# Patient Record
Sex: Female | Born: 1970 | Race: Black or African American | Hispanic: No | Marital: Single | Smoking: Former smoker
Health system: Southern US, Community
[De-identification: ages and names within clinical notes are randomized; demographics above are authoritative.]

## PROBLEM LIST (undated history)

## (undated) DIAGNOSIS — I10 Essential (primary) hypertension: Secondary | ICD-10-CM

## (undated) HISTORY — PX: DENTAL SURGERY: SHX609

---

## 2002-12-28 ENCOUNTER — Other Ambulatory Visit: Admission: RE | Admit: 2002-12-28 | Discharge: 2002-12-28 | Payer: Self-pay | Admitting: Obstetrics and Gynecology

## 2003-09-17 ENCOUNTER — Emergency Department (HOSPITAL_COMMUNITY): Admission: EM | Admit: 2003-09-17 | Discharge: 2003-09-17 | Payer: Self-pay | Admitting: Family Medicine

## 2007-02-24 ENCOUNTER — Emergency Department (HOSPITAL_COMMUNITY): Admission: EM | Admit: 2007-02-24 | Discharge: 2007-02-24 | Payer: Self-pay | Admitting: Family Medicine

## 2007-06-10 ENCOUNTER — Emergency Department (HOSPITAL_COMMUNITY): Admission: EM | Admit: 2007-06-10 | Discharge: 2007-06-10 | Payer: Self-pay | Admitting: Emergency Medicine

## 2008-03-30 ENCOUNTER — Emergency Department (HOSPITAL_COMMUNITY): Admission: EM | Admit: 2008-03-30 | Discharge: 2008-03-30 | Payer: Self-pay | Admitting: Emergency Medicine

## 2008-11-25 ENCOUNTER — Emergency Department (HOSPITAL_COMMUNITY): Admission: EM | Admit: 2008-11-25 | Discharge: 2008-11-25 | Payer: Self-pay | Admitting: Emergency Medicine

## 2010-05-15 LAB — URINALYSIS, ROUTINE W REFLEX MICROSCOPIC
Glucose, UA: NEGATIVE mg/dL
Ketones, ur: NEGATIVE mg/dL
Urobilinogen, UA: 0.2 mg/dL (ref 0.0–1.0)
pH: 5.5 (ref 5.0–8.0)

## 2010-05-15 LAB — PREGNANCY, URINE: Preg Test, Ur: NEGATIVE

## 2010-10-03 ENCOUNTER — Emergency Department (HOSPITAL_COMMUNITY)
Admission: EM | Admit: 2010-10-03 | Discharge: 2010-10-03 | Disposition: A | Discharge status: Home / Self Care | Payer: No Typology Code available for payment source | Attending: Emergency Medicine | Admitting: Emergency Medicine

## 2010-10-03 DIAGNOSIS — T148XXA Other injury of unspecified body region, initial encounter: Secondary | ICD-10-CM | POA: Insufficient documentation

## 2010-10-03 DIAGNOSIS — M79609 Pain in unspecified limb: Secondary | ICD-10-CM | POA: Insufficient documentation

## 2010-10-03 DIAGNOSIS — I10 Essential (primary) hypertension: Secondary | ICD-10-CM | POA: Insufficient documentation

## 2010-10-03 DIAGNOSIS — M542 Cervicalgia: Secondary | ICD-10-CM | POA: Insufficient documentation

## 2010-10-03 DIAGNOSIS — Y92410 Unspecified street and highway as the place of occurrence of the external cause: Secondary | ICD-10-CM | POA: Insufficient documentation

## 2010-10-03 DIAGNOSIS — M549 Dorsalgia, unspecified: Secondary | ICD-10-CM | POA: Insufficient documentation

## 2010-10-07 ENCOUNTER — Emergency Department (HOSPITAL_COMMUNITY)
Admission: EM | Admit: 2010-10-07 | Discharge: 2010-10-07 | Disposition: A | Discharge status: Home / Self Care | Payer: No Typology Code available for payment source | Attending: Emergency Medicine | Admitting: Emergency Medicine

## 2010-10-07 DIAGNOSIS — M545 Low back pain, unspecified: Secondary | ICD-10-CM | POA: Insufficient documentation

## 2010-10-07 DIAGNOSIS — M542 Cervicalgia: Secondary | ICD-10-CM | POA: Insufficient documentation

## 2010-10-07 DIAGNOSIS — I10 Essential (primary) hypertension: Secondary | ICD-10-CM | POA: Insufficient documentation

## 2010-10-07 DIAGNOSIS — M549 Dorsalgia, unspecified: Secondary | ICD-10-CM | POA: Insufficient documentation

## 2010-10-07 DIAGNOSIS — M79609 Pain in unspecified limb: Secondary | ICD-10-CM | POA: Insufficient documentation

## 2013-11-28 ENCOUNTER — Emergency Department (HOSPITAL_COMMUNITY)
Admission: EM | Admit: 2013-11-28 | Discharge: 2013-11-28 | Disposition: A | Discharge status: Home / Self Care | Payer: No Typology Code available for payment source | Attending: Emergency Medicine | Admitting: Emergency Medicine

## 2013-11-28 ENCOUNTER — Encounter (HOSPITAL_COMMUNITY): Payer: Self-pay | Admitting: Emergency Medicine

## 2013-11-28 DIAGNOSIS — Y9289 Other specified places as the place of occurrence of the external cause: Secondary | ICD-10-CM | POA: Insufficient documentation

## 2013-11-28 DIAGNOSIS — Z79899 Other long term (current) drug therapy: Secondary | ICD-10-CM | POA: Insufficient documentation

## 2013-11-28 DIAGNOSIS — I10 Essential (primary) hypertension: Secondary | ICD-10-CM | POA: Insufficient documentation

## 2013-11-28 DIAGNOSIS — S0501XA Injury of conjunctiva and corneal abrasion without foreign body, right eye, initial encounter: Secondary | ICD-10-CM | POA: Insufficient documentation

## 2013-11-28 DIAGNOSIS — Z720 Tobacco use: Secondary | ICD-10-CM | POA: Insufficient documentation

## 2013-11-28 DIAGNOSIS — X58XXXA Exposure to other specified factors, initial encounter: Secondary | ICD-10-CM | POA: Insufficient documentation

## 2013-11-28 DIAGNOSIS — Y9389 Activity, other specified: Secondary | ICD-10-CM | POA: Insufficient documentation

## 2013-11-28 HISTORY — DX: Essential (primary) hypertension: I10

## 2013-11-28 MED ORDER — POLYMYXIN B-TRIMETHOPRIM 10000-0.1 UNIT/ML-% OP SOLN: 1.0000 [drp] | OPHTHALMIC | Status: DC

## 2013-11-28 MED ORDER — FLUORESCEIN SODIUM 1 MG OP STRP
1.0000 | ORAL_STRIP | Freq: Once | OPHTHALMIC | Status: AC
  Administered 2013-11-28: 1 via OPHTHALMIC
  Filled 2013-11-28: qty 1

## 2013-11-28 MED ORDER — KETOROLAC TROMETHAMINE 0.5 % OP SOLN: 1.0000 [drp] | Freq: Four times a day (QID) | OPHTHALMIC | Status: DC

## 2013-11-28 MED ORDER — TETRACAINE HCL 0.5 % OP SOLN
1.0000 [drp] | Freq: Once | OPHTHALMIC | Status: AC
  Administered 2013-11-28: 1 [drp] via OPHTHALMIC
  Filled 2013-11-28: qty 2

## 2013-11-28 NOTE — Discharge Instructions (Signed)
Take the prescribed medication as directed. Follow-up with Dr. Anderson Malta if begin experiencing trouble with your vision or if symptoms worsen. Return to the ED for new or worsening symptoms.

## 2013-11-28 NOTE — ED Notes (Signed)
Pt complains of her right eye itching and draining, was in a wedding in [place] and had her makeup professionally done at that time

## 2013-11-28 NOTE — ED Provider Notes (Signed)
CSN: [id number]     Arrival date & time 11/28/13  2111 History   This chart was scribed for Quincy Carnes, PA-C, working with Wandra Arthurs, MD by Marti Sleigh, ED Scribe. This patient was seen in room [facility] and the patient's care was started at 11:34 PM.    Chief Complaint  Patient presents with  . Eye Pain   The history is provided by the patient. No language interpreter was used.   HPI Comments: Anna White is a 43 y.o. female who presents to the Emergency Department complaining of right eye itching that started several days ago. Pt states that she is experiencing constant right eye watering as an associated symptom. Pt states that she can barely open her eye in the morning due to crusty drainage. Pt states that she was in a wedding last month and had her makeup professionally done, and following the wedding she had similar symptoms in her left eye. Pt denies vision changes. Pt does not wear glasses or contact lenses.   Not currently established with an eye doctor.  Past Medical History  Diagnosis Date  . Hypertension    History reviewed. No pertinent past surgical history. History reviewed. No pertinent family history. History  Substance Use Topics  . Smoking status: Current Every Day Smoker  . Smokeless tobacco: Not on file  . Alcohol Use: Yes   OB History   Grav Para Term Preterm Abortions TAB SAB Ect Mult Living                 Review of Systems  Eyes: Positive for discharge, redness and itching.  Skin: Negative for color change.  Neurological: Negative for dizziness and headaches.  All other systems reviewed and are negative.     Allergies  Review of patient's allergies indicates no known allergies.  Home Medications   Prior to Admission medications   Medication Sig Start Date End Date Taking? Authorizing Provider  lisinopril-hydrochlorothiazide (PRINZIDE,ZESTORETIC) 20-12.5 MG per tablet Take 1 tablet by mouth daily.   Yes Historical Provider, MD   BP  148/88  Pulse 93  Temp(Src) 98.9 F (37.2 C) (Oral)  Resp 18  SpO2 100%  LMP 11/24/2013  Physical Exam  Nursing note and vitals reviewed. Constitutional: She is oriented to person, place, and time. She appears well-developed and well-nourished. No distress.  HENT:  Head: Normocephalic and atraumatic.  Mouth/Throat: Oropharynx is clear and moist.  Eyes: EOM and lids are normal. Pupils are equal, round, and reactive to light. Right conjunctiva is injected (mild). Left conjunctiva is not injected.  Slit lamp exam:      The right eye shows no corneal abrasion, no corneal flare, no corneal ulcer, no foreign body and no fluorescein uptake.  No lid edema or erythema, right conjunctiva mildly injected without noted hemorrhage, EOM's intact and nonpainful, no foreign body, no corneal abrasion or corneal ulcer, negative fluorescein uptake; small conjunctival abrasion of right lateral eye  Neck: Normal range of motion. No JVD present.  Cardiovascular: Normal rate, regular rhythm and normal heart sounds.   Pulmonary/Chest: Effort normal and breath sounds normal. No respiratory distress.  Abdominal: Soft. Bowel sounds are normal.  Musculoskeletal: Normal range of motion.  Neurological: She is alert and oriented to person, place, and time.  Skin: Skin is warm and dry. She is not diaphoretic.  Psychiatric: She has a normal mood and affect. Her behavior is normal.    ED Course  Procedures  DIAGNOSTIC STUDIES: Oxygen Saturation is 100%  on RA, normal by my interpretation.    COORDINATION OF CARE: 11:39 PM Discussed treatment plan with pt at bedside and pt agreed to plan.  Labs Review Labs Reviewed - No data to display  Imaging Review No results found.   EKG Interpretation None      MDM   Final diagnoses:  Conjunctival abrasion, right, initial encounter   43 year old female with right eye irritation and itching. On exam, she has noted edema or erythema to suggest orbital or preseptal  cellulitis. EOMs intact and nonpainful. Fluorescein exam with conjunctival abrasion. No corneal abrasion, ulcer, or foreign body. No visual disturbance. Patient started on Polytrim and acular drops.  Will FU with ophthalmology if problems occur.  Discussed plan with patient, he/she acknowledged understanding and agreed with plan of care.  Return precautions given for new or worsening symptoms.  I personally performed the services described in this documentation, which was scribed in my presence. The recorded information has been reviewed and is accurate.  Larene Pickett, PA-C 11/29/13 0008

## 2013-11-29 NOTE — ED Provider Notes (Signed)
Medical screening examination/treatment/procedure(s) were performed by non-physician practitioner and as supervising physician I was immediately available for consultation/collaboration.   EKG Interpretation None        Wandra Arthurs, MD 11/29/13 1501

## 2014-03-14 ENCOUNTER — Other Ambulatory Visit: Payer: Self-pay

## 2014-03-14 ENCOUNTER — Other Ambulatory Visit: Payer: Self-pay | Admitting: Internal Medicine

## 2014-03-14 DIAGNOSIS — N644 Mastodynia: Secondary | ICD-10-CM

## 2014-03-15 ENCOUNTER — Other Ambulatory Visit: Payer: Self-pay | Admitting: Internal Medicine

## 2014-03-15 DIAGNOSIS — N644 Mastodynia: Secondary | ICD-10-CM

## 2014-04-11 ENCOUNTER — Ambulatory Visit
Admission: RE | Admit: 2014-04-11 | Discharge: 2014-04-11 | Disposition: A | Discharge status: Home / Self Care | Payer: Managed Care, Other (non HMO) | Source: Ambulatory Visit | Attending: Internal Medicine | Admitting: Internal Medicine

## 2014-04-11 DIAGNOSIS — N644 Mastodynia: Secondary | ICD-10-CM

## 2014-05-16 ENCOUNTER — Encounter: Payer: Self-pay | Admitting: *Deleted

## 2016-04-08 ENCOUNTER — Other Ambulatory Visit: Payer: Self-pay | Admitting: Internal Medicine

## 2016-04-08 DIAGNOSIS — Z1231 Encounter for screening mammogram for malignant neoplasm of breast: Secondary | ICD-10-CM

## 2016-05-06 ENCOUNTER — Ambulatory Visit
Admission: RE | Admit: 2016-05-06 | Discharge: 2016-05-06 | Disposition: A | Discharge status: Home / Self Care | Payer: Managed Care, Other (non HMO) | Source: Ambulatory Visit | Attending: Internal Medicine | Admitting: Internal Medicine

## 2016-05-06 DIAGNOSIS — Z1231 Encounter for screening mammogram for malignant neoplasm of breast: Secondary | ICD-10-CM

## 2016-05-07 ENCOUNTER — Other Ambulatory Visit: Payer: Self-pay | Admitting: Internal Medicine

## 2016-05-07 DIAGNOSIS — R928 Other abnormal and inconclusive findings on diagnostic imaging of breast: Secondary | ICD-10-CM

## 2016-05-13 ENCOUNTER — Ambulatory Visit
Admission: RE | Admit: 2016-05-13 | Discharge: 2016-05-13 | Disposition: A | Discharge status: Home / Self Care | Payer: Managed Care, Other (non HMO) | Source: Ambulatory Visit | Attending: Internal Medicine | Admitting: Internal Medicine

## 2016-05-13 DIAGNOSIS — R928 Other abnormal and inconclusive findings on diagnostic imaging of breast: Secondary | ICD-10-CM

## 2016-05-22 ENCOUNTER — Emergency Department (HOSPITAL_COMMUNITY)
Admission: EM | Admit: 2016-05-22 | Discharge: 2016-05-22 | Disposition: A | Discharge status: Home / Self Care | Payer: Managed Care, Other (non HMO) | Attending: Emergency Medicine | Admitting: Emergency Medicine

## 2016-05-22 ENCOUNTER — Encounter (HOSPITAL_COMMUNITY): Payer: Self-pay | Admitting: Emergency Medicine

## 2016-05-22 ENCOUNTER — Emergency Department (HOSPITAL_COMMUNITY): Payer: Managed Care, Other (non HMO)

## 2016-05-22 DIAGNOSIS — F17200 Nicotine dependence, unspecified, uncomplicated: Secondary | ICD-10-CM | POA: Diagnosis not present

## 2016-05-22 DIAGNOSIS — I10 Essential (primary) hypertension: Secondary | ICD-10-CM | POA: Diagnosis not present

## 2016-05-22 DIAGNOSIS — M25512 Pain in left shoulder: Secondary | ICD-10-CM | POA: Insufficient documentation

## 2016-05-22 MED ORDER — METHOCARBAMOL 500 MG PO TABS: 500.0000 mg | ORAL_TABLET | Freq: Two times a day (BID) | ORAL | 0 refills | Status: DC

## 2016-05-22 NOTE — ED Notes (Signed)
Ice pack applied to left shoulder  

## 2016-05-22 NOTE — ED Triage Notes (Signed)
Pt complaint of left shoulder pain ongoing for 2 weeks; pt denies injury; pt seen by PCP on Wednesday "written out of work until today; went to work and they sent me home." Pt denies cardiac.

## 2016-05-22 NOTE — ED Provider Notes (Signed)
[facility] DEPT Provider Note   CSN: [id number] Arrival date & time: 05/22/16  1711     History   Chief Complaint Chief Complaint  Patient presents with  . Shoulder Pain    HPI Anna White is a 46 y.o. female.  HPI   46 year old female presents today with left shoulder pain.  Patient notes symptoms started approximately 2 weeks ago and have been worsening.  She denies any trauma or injury.  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  Patient notes pain in the left upper trapezius and left shoulder.  Worse with movement or palpation.  She denies any fever, chills, swelling, redness, or any other infectious etiology.  Patient was seen by primary care and diagnosed with muscle strain.  She was supposed to go back to work today but was unable to and was sent here for a work note.  She denies any chest pain, or distal neurological deficits.  Past Medical History:  Diagnosis Date  . Hypertension     There are no active problems to display for this patient.   History reviewed. No pertinent surgical history.  OB History    No data available       Home Medications    Prior to Admission medications   Medication Sig Start Date End Date Taking? Authorizing Provider  ketorolac (ACULAR) 0.5 % ophthalmic solution Place 1 drop into both eyes 4 (four) times daily. 11/28/13   Larene Pickett, PA-C  lisinopril-hydrochlorothiazide (PRINZIDE,ZESTORETIC) 20-12.5 MG per tablet Take 1 tablet by mouth daily.    Historical Provider, MD  methocarbamol (ROBAXIN) 500 MG tablet Take 1 tablet (500 mg total) by mouth 2 (two) times daily. 05/22/16   Okey Regal, PA-C  trimethoprim-polymyxin b (POLYTRIM) ophthalmic solution Place 1 drop into the right eye every 4 (four) hours. 11/28/13   Larene Pickett, PA-C    Family History No family history on file.  Social History Social History  Substance Use Topics  . Smoking status: Current Every Day Smoker  . Smokeless tobacco: Not on  file  . Alcohol use Yes     Allergies   Patient has no known allergies.   Review of Systems Review of Systems  All other systems reviewed and are negative.    Physical Exam Updated Vital Signs BP (!) 154/106 (BP Location: Right Arm) Comment: hx of high bp noted   Pulse 85   Temp 99.3 F (37.4 C) (Oral)   Resp 18   LMP 05/13/2016   SpO2 96%   Physical Exam  Constitutional: She is oriented to person, place, and time. She appears well-developed and well-nourished.  HENT:  Head: Normocephalic and atraumatic.  Eyes: Conjunctivae are normal. Pupils are equal, round, and reactive to light. Right eye exhibits no discharge. Left eye exhibits no discharge. No scleral icterus.  Neck: Normal range of motion. No JVD present. No tracheal deviation present.  Pulmonary/Chest: Effort normal. No stridor.  Musculoskeletal:  Tenderness to palpation of the upper trapezius and shoulder diffusely.  No significant pain with flexion extension internal/external rotation.  Radial pulses 2+, cap refill intact, sensation intact.  Strength 5 out of 5.  Neurological: She is alert and oriented to person, place, and time. Coordination normal.  Psychiatric: She has a normal mood and affect. Her behavior is normal. Judgment and thought content normal.  Nursing note and vitals reviewed.   ED Treatments / Results  Labs (all labs ordered are listed, but only abnormal results are displayed) Labs  Reviewed - No data to display  EKG  EKG Interpretation None       Radiology Dg Shoulder Left  Result Date: 05/22/2016 CLINICAL DATA:  Left shoulder pain.  No known injury. EXAM: LEFT SHOULDER - 2+ VIEW COMPARISON:  None. FINDINGS: There is no evidence of fracture or dislocation. There is no evidence of arthropathy or other focal bone abnormality. Soft tissues are unremarkable. IMPRESSION: Negative. Electronically Signed   By: Lorriane Shire M.D.   On: 05/22/2016 17:45    Procedures Procedures (including  critical care time)  Medications Ordered in ED Medications - No data to display   Initial Impression / Assessment and Plan / ED Course  I have reviewed the triage vital signs and the nursing notes.  Pertinent labs & imaging results that were available during my care of the patient were reviewed by me and considered in my medical decision making (see chart for details).      Final Clinical Impressions(s) / ED Diagnoses   Final diagnoses:  Acute pain of left shoulder    Patient presents with likely musculoskeletal shoulder pain.  No signs of infectious etiology.  She has tenderness upon palpation.  She will be given a prescription for muscle relaxers, encouraged to continue using diclofenac.  She will be given a sling, orthopedic follow-up and a work note.  Strict return precautions given.  She verbalized understanding and agreement to today's plan and had no further questions or concerns at the time discharge  New Prescriptions Discharge Medication List as of 05/22/2016  8:04 PM    START taking these medications   Details  methocarbamol (ROBAXIN) 500 MG tablet Take 1 tablet (500 mg total) by mouth 2 (two) times daily., Starting Fri 05/22/2016, Print         Okey Regal, PA-C 05/22/16 [place]o, MD 05/25/16 [phone]

## 2016-05-22 NOTE — Discharge Instructions (Signed)
Please read attached information. If you experience any new or worsening signs or symptoms please return to the emergency room for evaluation. Please follow-up with your primary care provider or specialist as discussed. Please use medication prescribed only as directed and discontinue taking if you have any concerning signs or symptoms.   °

## 2017-01-06 DIAGNOSIS — F17200 Nicotine dependence, unspecified, uncomplicated: Secondary | ICD-10-CM | POA: Insufficient documentation

## 2017-01-06 DIAGNOSIS — I10 Essential (primary) hypertension: Secondary | ICD-10-CM | POA: Insufficient documentation

## 2017-01-06 DIAGNOSIS — N926 Irregular menstruation, unspecified: Secondary | ICD-10-CM | POA: Insufficient documentation

## 2017-01-06 DIAGNOSIS — Z87410 Personal history of cervical dysplasia: Secondary | ICD-10-CM | POA: Insufficient documentation

## 2017-01-06 DIAGNOSIS — E669 Obesity, unspecified: Secondary | ICD-10-CM | POA: Insufficient documentation

## 2018-12-14 DIAGNOSIS — D649 Anemia, unspecified: Secondary | ICD-10-CM | POA: Insufficient documentation

## 2019-02-23 DIAGNOSIS — N644 Mastodynia: Secondary | ICD-10-CM | POA: Insufficient documentation

## 2019-03-05 IMAGING — CR DG SHOULDER 2+V*L*
3 series · 3 of 3 positions shown · non-contrast
Comparison: None.

CLINICAL DATA: Left shoulder pain.  No known injury.

EXAM:
LEFT SHOULDER - 2+ VIEW

[w shoulder external left]
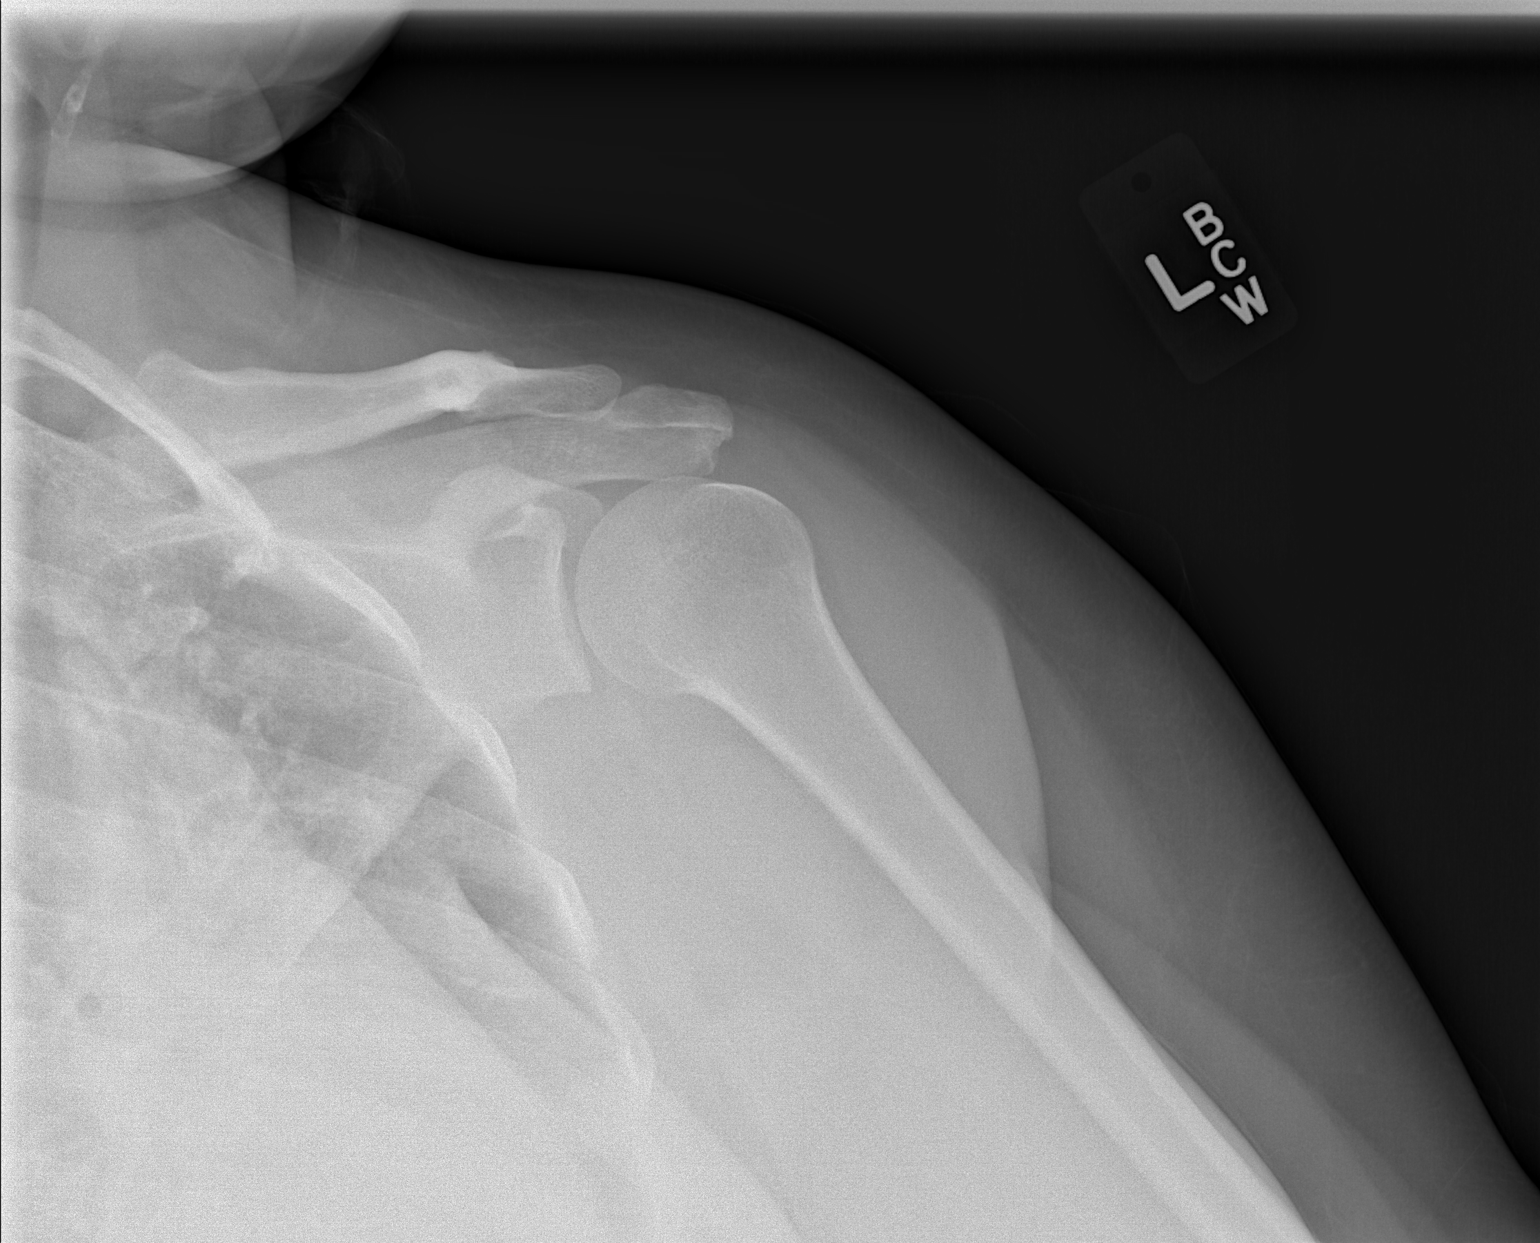

[w shoulder y-view left]
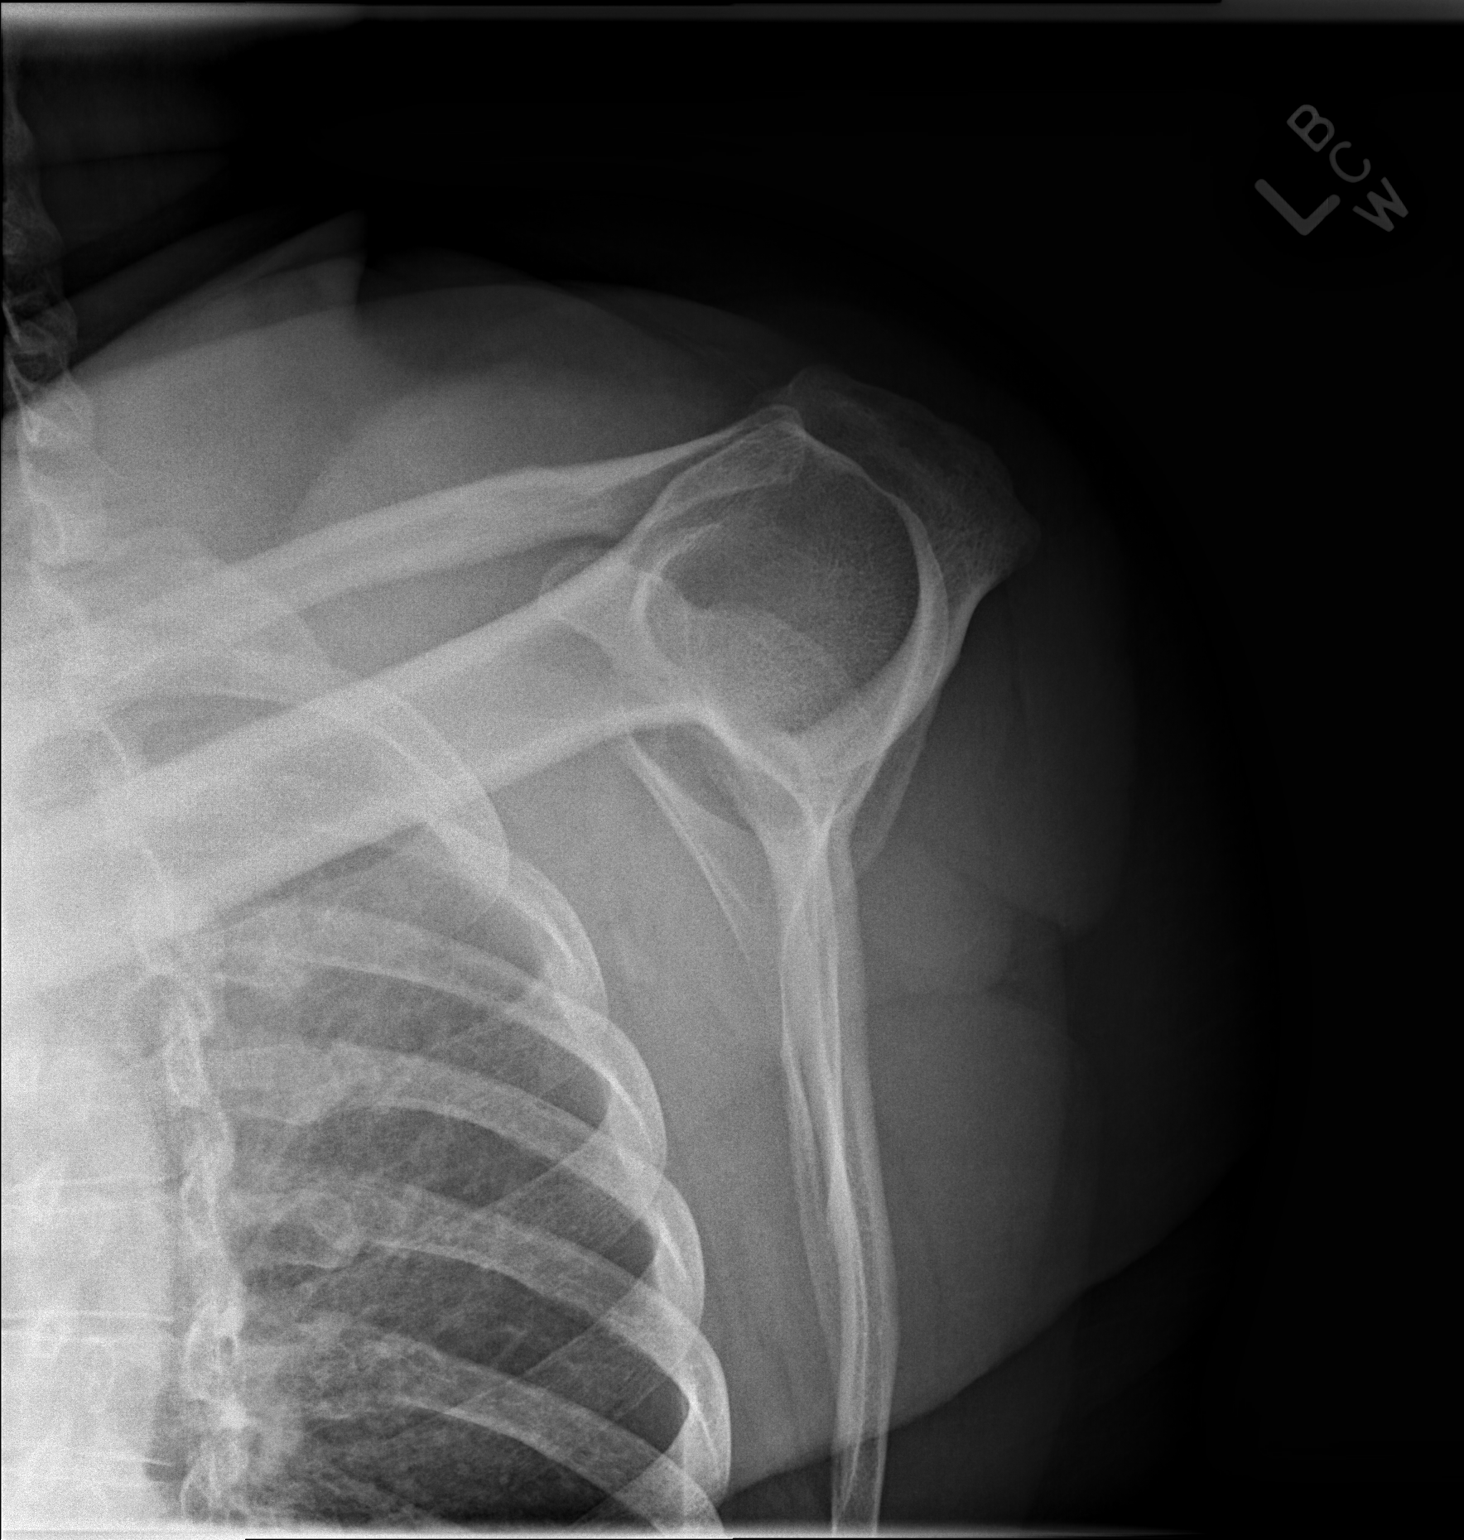

[x shoulder axillary left]
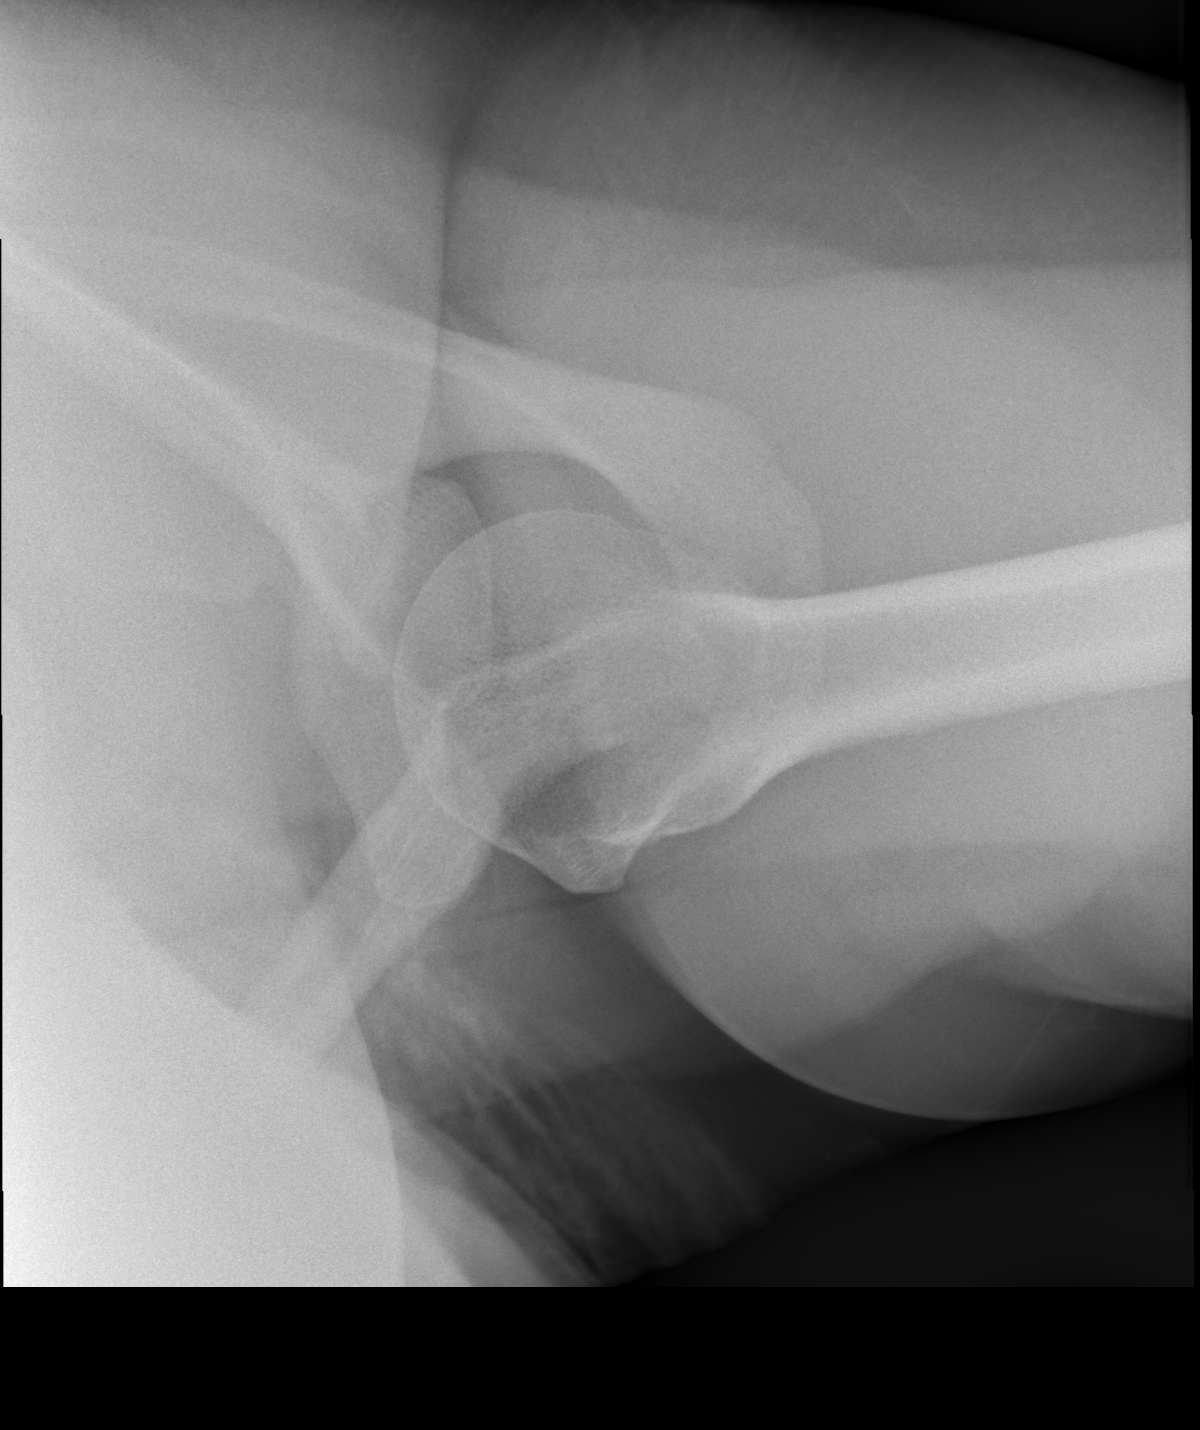

[3 of 3 positions shown; findings below may reference images not displayed]

FINDINGS: There is no evidence of fracture or dislocation. There is no
evidence of arthropathy or other focal bone abnormality. Soft
tissues are unremarkable.
IMPRESSION: Negative.

## 2021-02-27 DIAGNOSIS — D259 Leiomyoma of uterus, unspecified: Secondary | ICD-10-CM | POA: Insufficient documentation

## 2021-03-14 ENCOUNTER — Other Ambulatory Visit: Payer: Self-pay | Admitting: Obstetrics and Gynecology

## 2021-03-14 DIAGNOSIS — D259 Leiomyoma of uterus, unspecified: Secondary | ICD-10-CM

## 2021-03-21 ENCOUNTER — Encounter: Payer: Self-pay | Admitting: *Deleted

## 2021-03-21 ENCOUNTER — Other Ambulatory Visit: Payer: Self-pay

## 2021-03-21 ENCOUNTER — Ambulatory Visit
Admission: RE | Admit: 2021-03-21 | Discharge: 2021-03-21 | Disposition: A | Discharge status: Home / Self Care | Payer: Managed Care, Other (non HMO) | Source: Ambulatory Visit | Attending: Obstetrics and Gynecology | Admitting: Obstetrics and Gynecology

## 2021-03-21 DIAGNOSIS — D259 Leiomyoma of uterus, unspecified: Secondary | ICD-10-CM

## 2021-03-21 HISTORY — PX: IR RADIOLOGIST EVAL & MGMT: IMG5224

## 2021-03-21 NOTE — Consult Note (Signed)
Chief Complaint: Symptomatic   Referring Physician(s): Callahan,Sidney  History of Present Illness: Anna White is a 51 y.o. (G1, P78) female with past medical history significant for hypertension and obesity who is seen today in telemedicine consultation for evaluation of symptomatic uterine fibroids  Patient has experienced excessive menstrual bleeding for the past several years, worsening recently.  Patient states that her cycle lasts anywhere from 8 to 16 days, the first several of which are associated with excessive bleeding necessitating the wearing of 2 high absorbent pads.  Patient does report recent intraprocedural spotting.  The patient denies bulk related symptoms.  Specifically, no pelvic pain, back pain, bloating, dysuria or frequency.  She reports perimenopausal symptoms including a recently slightly irregular cycle and experiencing hot flashes.  The patient wishes to avoid a surgical intervention.   Past Medical History:  Diagnosis Date   Hypertension     No past surgical history on file.  Allergies: Patient has no known allergies.  Medications: Prior to Admission medications   Medication Sig Start Date End Date Taking? Authorizing Provider  ketorolac (ACULAR) 0.5 % ophthalmic solution Place 1 drop into both eyes 4 (four) times daily. 11/28/13   Larene Pickett, PA-C  lisinopril-hydrochlorothiazide (PRINZIDE,ZESTORETIC) 20-12.5 MG per tablet Take 1 tablet by mouth daily.    [provider]  methocarbamol (ROBAXIN) 500 MG tablet Take 1 tablet (500 mg total) by mouth 2 (two) times daily. 05/22/16   Hedges, Dellis Filbert, PA-C  trimethoprim-polymyxin b (POLYTRIM) ophthalmic solution Place 1 drop into the right eye every 4 (four) hours. 11/28/13   Larene Pickett, PA-C     No family history on file.  Social History   Socioeconomic History   Marital status: Single    Spouse name: Not on file   Number of children: Not on file   Years of education:  Not on file   Highest education level: Not on file  Occupational History   Not on file  Tobacco Use   Smoking status: Every Day   Smokeless tobacco: Not on file  Substance and Sexual Activity   Alcohol use: Yes   Drug use: Not on file   Sexual activity: Not on file  Other Topics Concern   Not on file  Social History Narrative   Not on file   Social Determinants of Health   Financial Resource Strain: Not on file  Food Insecurity: Not on file  Transportation Needs: Not on file  Physical Activity: Not on file  Stress: Not on file  Social Connections: Not on file    ECOG Status: 1 - Symptomatic but completely ambulatory  Review of Systems  Review of Systems: A 12 point ROS discussed and pertinent positives are indicated in the HPI above.  All other systems are negative.  Physical Exam No direct physical exam was performed (except for noted visual exam findings with Video Visits).   Vital Signs: There were no vitals taken for this visit.  Imaging:  Intraoffice pelvic ultrasound performed 03/12/2021 (report only, no images) -  Approximately 2.3 cm fibroid within the mid fundus, an approximately 4.4 cm fibroid within the left side of the uterine body, an approximately 2.4 cm fibroid within the right side of the posterior aspect of the uterine body and an approximately 3.4 cm fibroid within the posterior aspect of the fundus.  The bilateral ovaries were not visualized due to bowel gas.  Labs:  CBC: No results for input(s): WBC, HGB, HCT, PLT in the last  8760 hours.  COAGS: No results for input(s): INR, APTT in the last 8760 hours.  BMP: No results for input(s): NA, K, CL, CO2, GLUCOSE, BUN, CALCIUM, CREATININE, GFRNONAA, GFRAA in the last 8760 hours.  Invalid input(s): CMP  Pap smear - 02/27/2021 (negative). Endometrial biopsy - 03/12/2021 (negative)   Assessment and Plan:  Anna White is a 51 y.o. (G1, P74) female with past medical history significant for  hypertension and obesity who is seen today in telemedicine consultation for evaluation of symptomatic uterine fibroids.  Patient is primary fibroid related complaint is menorrhagia.  Intraoffice pelvic ultrasound performed 03/12/2021 (report only, no images) -  Approximately 2.3 cm fibroid within the mid fundus, an approximately 4.4 cm fibroid within the left side of the uterine body, an approximately 2.4 cm fibroid within the right side of the posterior aspect of the uterine body and an approximately 3.4 cm fibroid within the posterior aspect of the fundus.  The bilateral ovaries were not visualized due to bowel gas.  Prolonged conversations were held with the patient regarding potential treatment options for her symptomatic uterine fibroids including continued conservative management and hysterectomy.  At the present time, the patient wishes to undergo an intervention though wishes to avoid surgery.  As such, prolonged conversations were held with the patient regarding the benefits and risks (including but not limited to bleeding, vessel injury, nontarget embolization, infection, contrast and radiation exposure) of uterine fibroid embolization.   Following this prolonged and detailed conversation, the patient wishes to pursue uterine fibroid embolization.  As such, I will obtain a contrast enhanced pelvic MRI to better delineate the fibroid size and burden and to ensure fibroid enhancement given her perimenopausal state.   Of note, the patient had a recent Pap smear (02/27/2021) and endometrial biopsy (03/12/2021) both of which were negative.  Ultimately the uterine fibroid embolization will be performed at [facility] and will entail an overnight admission for continued observation and PCA usage.   The patient knows to call the interventional radiology clinic with any interval questions or concerns.  Plan: - Obtain contrast-enhanced pelvic MRI to better delineate the size, number and  location of uterine fibroids as well as to ensure fibroid enhancement. - Once pelvic MRI is obtained and reviewed, schedule uterine fibroid embolization at [facility].  The procedure will entail an overnight admission for continued observation and PCA usage.  Thank you for this interesting consult.  I greatly enjoyed meeting Domenic Polite and look forward to participating in their care.  A copy of this report was sent to the requesting provider on this date.  Electronically Signed: Sandi Mariscal 03/21/2021, 1:31 PM   I spent a total of 15 Minutes in remote  clinical consultation, greater than 50% of which was counseling/coordinating care for symptomatic uterine fibroids.    Visit type: Audio only (telephone). Audio (no video) only due to patient's lack of internet/smartphone capability. Alternative for in-person consultation at [facility], [address] [place], [place], [place]. This visit type was conducted due to national recommendations for restrictions regarding the COVID-19 Pandemic (e.g. social distancing).  This format is felt to be most appropriate for this patient at this time.  All issues noted in this document were discussed and addressed.

## 2021-04-08 ENCOUNTER — Other Ambulatory Visit: Payer: Self-pay | Admitting: *Deleted

## 2021-04-08 DIAGNOSIS — D251 Intramural leiomyoma of uterus: Secondary | ICD-10-CM

## 2021-12-03 ENCOUNTER — Emergency Department (HOSPITAL_BASED_OUTPATIENT_CLINIC_OR_DEPARTMENT_OTHER)
Admission: EM | Admit: 2021-12-03 | Discharge: 2021-12-03 | Disposition: A | Discharge status: Home / Self Care | Payer: Managed Care, Other (non HMO) | Attending: Emergency Medicine | Admitting: Emergency Medicine

## 2021-12-03 ENCOUNTER — Other Ambulatory Visit: Payer: Self-pay

## 2021-12-03 ENCOUNTER — Encounter (HOSPITAL_BASED_OUTPATIENT_CLINIC_OR_DEPARTMENT_OTHER): Payer: Self-pay

## 2021-12-03 DIAGNOSIS — I10 Essential (primary) hypertension: Secondary | ICD-10-CM | POA: Diagnosis not present

## 2021-12-03 DIAGNOSIS — U071 COVID-19: Secondary | ICD-10-CM | POA: Diagnosis not present

## 2021-12-03 DIAGNOSIS — R059 Cough, unspecified: Secondary | ICD-10-CM | POA: Diagnosis present

## 2021-12-03 DIAGNOSIS — Z79899 Other long term (current) drug therapy: Secondary | ICD-10-CM | POA: Diagnosis not present

## 2021-12-03 LAB — GROUP A STREP BY PCR: Group A Strep by PCR: NOT DETECTED

## 2021-12-03 LAB — RESP PANEL BY RT-PCR (FLU A&B, COVID) ARPGX2
Influenza A by PCR: NEGATIVE
Influenza B by PCR: NEGATIVE
SARS Coronavirus 2 by RT PCR: POSITIVE — AB

## 2021-12-03 MED ORDER — BENZONATATE 100 MG PO CAPS: 100.0000 mg | ORAL_CAPSULE | Freq: Three times a day (TID) | ORAL | 0 refills | Status: DC

## 2021-12-03 NOTE — ED Triage Notes (Signed)
Patient here POV from Home.  Endorses URI Symptoms for approximately a Few Days. Symptoms include Dry Cough, Chills, Aches, Sore Throat, Subjective Fevers.   NAD Noted during Triage. A&Ox4. GCS 15. Ambulatory.

## 2021-12-03 NOTE — Discharge Instructions (Addendum)
Drink plenty of fluids at home, take the [facility] as needed every 8 hours for cough. Continue taking the DayQuil and NyQuil for symptoms.  Follow-up with your primary in a week if no improvement.  Return to work on Monday.

## 2021-12-03 NOTE — ED Provider Notes (Signed)
[facility] EMERGENCY DEPT Provider Note   CSN: [id number] Arrival date & time: 12/03/21  1407     History  Chief Complaint  Patient presents with   Cough    Anna White is a 51 y.o. female.   Cough    Patient with history of hypertension presents today due to cough and viral symptoms x1 week.  Started after coming back from [facility] opening.  She is having objective cough, generalized body, headache, nasal congestion.  She is making NyQuil and DayQuil with some improvement.  Home Medications Prior to Admission medications   Medication Sig Start Date End Date Taking? Authorizing Provider  ketorolac (ACULAR) 0.5 % ophthalmic solution Place 1 drop into both eyes 4 (four) times daily. 11/28/13   Larene Pickett, PA-C  lisinopril-hydrochlorothiazide (PRINZIDE,ZESTORETIC) 20-12.5 MG per tablet Take 1 tablet by mouth daily.    [provider]  methocarbamol (ROBAXIN) 500 MG tablet Take 1 tablet (500 mg total) by mouth 2 (two) times daily. 05/22/16   Hedges, Dellis Filbert, PA-C  trimethoprim-polymyxin b (POLYTRIM) ophthalmic solution Place 1 drop into the right eye every 4 (four) hours. 11/28/13   Larene Pickett, PA-C      Allergies    Patient has no known allergies.    Review of Systems   Review of Systems  Respiratory:  Positive for cough.     Physical Exam Updated Vital Signs BP (!) 142/93 (BP Location: Right Arm)   Pulse 78   Temp 97.9 F (36.6 C) (Oral)   Resp 18   Ht 5' (1.524 m)   Wt 93.4 kg   SpO2 100%   BMI 40.23 kg/m  Physical Exam Vitals and nursing note reviewed. Exam conducted with a chaperone present.  Constitutional:      Appearance: Normal appearance.  HENT:     Head: Normocephalic and atraumatic.     Nose: Congestion present.  Eyes:     General: No scleral icterus.       Right eye: No discharge.        Left eye: No discharge.     Extraocular Movements: Extraocular movements intact.     Pupils: Pupils are equal, round, and  reactive to light.  Cardiovascular:     Rate and Rhythm: Normal rate and regular rhythm.     Pulses: Normal pulses.     Heart sounds: Normal heart sounds. No murmur heard.    No friction rub. No gallop.  Pulmonary:     Effort: Pulmonary effort is normal. No respiratory distress.     Breath sounds: Normal breath sounds.  Abdominal:     General: Abdomen is flat. Bowel sounds are normal. There is no distension.     Palpations: Abdomen is soft.     Tenderness: There is no abdominal tenderness.  Skin:    General: Skin is warm and dry.     Coloration: Skin is not jaundiced.  Neurological:     Mental Status: She is alert. Mental status is at baseline.     Coordination: Coordination normal.     ED Results / Procedures / Treatments   Labs (all labs ordered are listed, but only abnormal results are displayed) Labs Reviewed  RESP PANEL BY RT-PCR (FLU A&B, COVID) ARPGX2 - Abnormal; Notable for the following components:      Result Value   SARS Coronavirus 2 by RT PCR POSITIVE (*)    All other components within normal limits  GROUP A STREP BY PCR  EKG None  Radiology No results found.  Procedures Procedures    Medications Ordered in ED Medications - No data to display  ED Course/ Medical Decision Making/ A&P                           Medical Decision Making  Patient presents due to viral symptoms x1 week.  On exam she is overall well-appearing, her lungs are clear to auscultation.  She is afebrile, not hypoxic.  I considered pneumonia but given acuity symptoms, lack of fever, no chest pain or shortness of breath I do not think this is likely especially given the consolidation.  Patient is COVID-positive.  Symptoms been going on for greater than 5 days and not a candidate for antivirals.  Discussed supportive care, return precautions and outpatient follow-up.        Final Clinical Impression(s) / ED Diagnoses Final diagnoses:  None    Rx / DC Orders ED  Discharge Orders     None         Sherrill Raring, PA-C 12/03/21 1647    Sherwood Gambler, MD 12/05/21 1535

## 2022-02-17 ENCOUNTER — Other Ambulatory Visit: Payer: Self-pay | Admitting: Internal Medicine

## 2022-02-17 DIAGNOSIS — Z1231 Encounter for screening mammogram for malignant neoplasm of breast: Secondary | ICD-10-CM

## 2022-04-13 ENCOUNTER — Ambulatory Visit: Payer: Managed Care, Other (non HMO)

## 2022-05-12 ENCOUNTER — Other Ambulatory Visit: Payer: Self-pay

## 2022-05-12 ENCOUNTER — Emergency Department (HOSPITAL_BASED_OUTPATIENT_CLINIC_OR_DEPARTMENT_OTHER)
Admission: EM | Admit: 2022-05-12 | Discharge: 2022-05-12 | Disposition: A | Discharge status: Home / Self Care | Payer: Managed Care, Other (non HMO) | Attending: Emergency Medicine | Admitting: Emergency Medicine

## 2022-05-12 ENCOUNTER — Encounter (HOSPITAL_BASED_OUTPATIENT_CLINIC_OR_DEPARTMENT_OTHER): Payer: Self-pay

## 2022-05-12 ENCOUNTER — Emergency Department (HOSPITAL_BASED_OUTPATIENT_CLINIC_OR_DEPARTMENT_OTHER): Payer: Managed Care, Other (non HMO)

## 2022-05-12 DIAGNOSIS — D259 Leiomyoma of uterus, unspecified: Secondary | ICD-10-CM | POA: Diagnosis not present

## 2022-05-12 DIAGNOSIS — Z79899 Other long term (current) drug therapy: Secondary | ICD-10-CM | POA: Insufficient documentation

## 2022-05-12 DIAGNOSIS — K5790 Diverticulosis of intestine, part unspecified, without perforation or abscess without bleeding: Secondary | ICD-10-CM

## 2022-05-12 DIAGNOSIS — I10 Essential (primary) hypertension: Secondary | ICD-10-CM | POA: Insufficient documentation

## 2022-05-12 DIAGNOSIS — E876 Hypokalemia: Secondary | ICD-10-CM | POA: Insufficient documentation

## 2022-05-12 DIAGNOSIS — K5730 Diverticulosis of large intestine without perforation or abscess without bleeding: Secondary | ICD-10-CM | POA: Diagnosis not present

## 2022-05-12 DIAGNOSIS — R1031 Right lower quadrant pain: Secondary | ICD-10-CM | POA: Diagnosis present

## 2022-05-12 DIAGNOSIS — G8929 Other chronic pain: Secondary | ICD-10-CM

## 2022-05-12 LAB — CBC WITH DIFFERENTIAL/PLATELET
Abs Immature Granulocytes: 0.01 10*3/uL (ref 0.00–0.07)
Basophils Absolute: 0.1 10*3/uL (ref 0.0–0.1)
Basophils Relative: 1 %
Eosinophils Absolute: 0.4 10*3/uL (ref 0.0–0.5)
Eosinophils Relative: 6 %
HCT: 38.2 % (ref 36.0–46.0)
Hemoglobin: 12.3 g/dL (ref 12.0–15.0)
Immature Granulocytes: 0 %
Lymphocytes Relative: 39 %
Lymphs Abs: 2.6 10*3/uL (ref 0.7–4.0)
MCH: 27.1 pg (ref 26.0–34.0)
MCHC: 32.2 g/dL (ref 30.0–36.0)
MCV: 84.1 fL (ref 80.0–100.0)
Monocytes Absolute: 0.5 10*3/uL (ref 0.1–1.0)
Monocytes Relative: 7 %
Neutro Abs: 3.2 10*3/uL (ref 1.7–7.7)
Neutrophils Relative %: 47 %
Platelets: 314 10*3/uL (ref 150–400)
RBC: 4.54 MIL/uL (ref 3.87–5.11)
RDW: 14.3 % (ref 11.5–15.5)
WBC: 6.8 10*3/uL (ref 4.0–10.5)
nRBC: 0 % (ref 0.0–0.2)

## 2022-05-12 LAB — HCG, QUANTITATIVE, PREGNANCY: hCG, Beta Chain, Quant, S: <1 m[IU]/mL (ref <5)

## 2022-05-12 LAB — LIPASE, BLOOD: Lipase: 12 U/L (ref 11–51)

## 2022-05-12 LAB — COMPREHENSIVE METABOLIC PANEL
ALT: 24 U/L (ref 0–44)
AST: 22 U/L (ref 15–41)
Albumin: 4.3 g/dL (ref 3.5–5.0)
Alkaline Phosphatase: 46 U/L (ref 38–126)
Anion gap: 13 (ref 5–15)
BUN: 14 mg/dL (ref 6–20)
CO2: 25 mmol/L (ref 22–32)
Calcium: 9.5 mg/dL (ref 8.9–10.3)
Chloride: 99 mmol/L (ref 98–111)
Creatinine, Ser: 0.64 mg/dL (ref 0.44–1.00)
GFR, Estimated: >60 mL/min (ref >60)
Glucose, Bld: 151 mg/dL — ABNORMAL HIGH (ref 70–99)
Potassium: 3 mmol/L — ABNORMAL LOW (ref 3.5–5.1)
Sodium: 137 mmol/L (ref 135–145)
Total Bilirubin: 0.3 mg/dL (ref 0.3–1.2)
Total Protein: 8 g/dL (ref 6.5–8.1)

## 2022-05-12 LAB — MAGNESIUM: Magnesium: 1.9 mg/dL (ref 1.7–2.4)

## 2022-05-12 MED ORDER — IOHEXOL 350 MG/ML SOLN
100.0000 mL | Freq: Once | INTRAVENOUS | Status: AC | PRN
  Administered 2022-05-12: 80 mL via INTRAVENOUS

## 2022-05-12 MED ORDER — POTASSIUM CHLORIDE CRYS ER 20 MEQ PO TBCR
40.0000 meq | EXTENDED_RELEASE_TABLET | Freq: Once | ORAL | Status: AC
  Administered 2022-05-12: 40 meq via ORAL
  Filled 2022-05-12: qty 2

## 2022-05-12 MED ORDER — DICYCLOMINE HCL 10 MG PO CAPS
10.0000 mg | ORAL_CAPSULE | Freq: Once | ORAL | Status: AC
  Administered 2022-05-12: 10 mg via ORAL
  Filled 2022-05-12: qty 1

## 2022-05-12 NOTE — ED Triage Notes (Signed)
POV from home, A&O x 4, GCS 15, amb to room  Pt sts that a couple months ago RLQ pain began and has been progressively getting worse, denies NVD, dysuria, but sts intermittent constipation. Sts LBM yesterday but wasn't a lot of stool.

## 2022-05-12 NOTE — ED Notes (Signed)
Patient transported to CT 

## 2022-05-12 NOTE — ED Notes (Signed)
All appropriate discharge materials reviewed at length with patient. Time for questions provided. Pt has no other questions at this time and verbalizes understanding of all provided materials.  

## 2022-05-12 NOTE — Discharge Instructions (Addendum)
Your labs revealed mild low potassium which was replenished. Recommend supplementing your your diet with foods high in calcium like bananas, pistachios and follow-up outpatient with your gynecologist. Take Tylenol and Ibuprofen for pain.  You had an incidental finding of diverticulosis on your CT.  If you develop fever, chills and persistent right or left lower quadrant abdominal pain, return to the emergency department as this could be evidence of an infection called diverticulitis.  CT results: IMPRESSION:  Enlarged uterus containing multiple nodules up to 5.1 cm consistent  with leiomyomata.    Minimal distal colonic diverticulosis without evidence of  diverticulitis.    No acute intra-abdominal or intrapelvic abnormalities.    Aortic Atherosclerosis (ICD10-I70.0).

## 2022-05-12 NOTE — ED Provider Notes (Signed)
[facility] Provider Note   CSN: [id number] Arrival date & time: 05/12/22  [id number]     History  Chief Complaint  Patient presents with   Abdominal Pain    Anna White is a 52 y.o. female.   Abdominal Pain    52 year old female with medical history significant for hypertension who presents to the emergency department with a chief complaint of right lower quadrant abdominal pain.  The patient states that it began a couple of months ago and has been progressively getting worse.  It initially was intermittent and now is persistent all the time in the right lower quadrant.  She denies any weight loss.  She denies any fevers or chills.  She denies any nausea, vomiting, diarrhea, dysuria.  She does endorse intermittent constipation.  Her last bowel movement was yesterday and she passed some pellets.  She states that she has tried medications for constipation previously but cannot remember the name of them.  Not yet followed up with her PCP regarding her discomfort. She denies any vaginal bleeding or vaginal discharge.  She states that she still gets menstrual periods but they are more irregular now occurring every 3 months.  Home Medications Prior to Admission medications   Medication Sig Start Date End Date Taking? Authorizing Provider  benzonatate (TESSALON) 100 MG capsule Take 1 capsule (100 mg total) by mouth every 8 (eight) hours. 12/03/21   Sherrill Raring, PA-C  ketorolac (ACULAR) 0.5 % ophthalmic solution Place 1 drop into both eyes 4 (four) times daily. 11/28/13   Larene Pickett, PA-C  lisinopril-hydrochlorothiazide (PRINZIDE,ZESTORETIC) 20-12.5 MG per tablet Take 1 tablet by mouth daily.    [provider]  methocarbamol (ROBAXIN) 500 MG tablet Take 1 tablet (500 mg total) by mouth 2 (two) times daily. 05/22/16   Hedges, Dellis Filbert, PA-C  trimethoprim-polymyxin b (POLYTRIM) ophthalmic solution Place 1 drop into the right eye every 4  (four) hours. 11/28/13   Larene Pickett, PA-C      Allergies    Patient has no known allergies.    Review of Systems   Review of Systems  Gastrointestinal:  Positive for abdominal pain.    Physical Exam Updated Vital Signs BP 135/80   Pulse 78   Temp 98.4 F (36.9 C)   Resp 14   Ht 5' (1.524 m)   Wt 95.3 kg   SpO2 96%   BMI 41.01 kg/m  Physical Exam Vitals and nursing note reviewed.  Constitutional:      General: She is not in acute distress.    Appearance: She is well-developed. She is obese. She is not ill-appearing.  HENT:     Head: Normocephalic and atraumatic.  Eyes:     Conjunctiva/sclera: Conjunctivae normal.  Cardiovascular:     Rate and Rhythm: Normal rate and regular rhythm.     Heart sounds: No murmur heard. Pulmonary:     Effort: Pulmonary effort is normal. No respiratory distress.     Breath sounds: Normal breath sounds.  Abdominal:     Palpations: Abdomen is soft.     Tenderness: There is abdominal tenderness in the right lower quadrant. There is no guarding or rebound.  Musculoskeletal:        General: No swelling.     Cervical back: Neck supple.  Skin:    General: Skin is warm and dry.     Capillary Refill: Capillary refill takes less than 2 seconds.  Neurological:     Mental  Status: She is alert.  Psychiatric:        Mood and Affect: Mood normal.     ED Results / Procedures / Treatments   Labs (all labs ordered are listed, but only abnormal results are displayed) Labs Reviewed  COMPREHENSIVE METABOLIC PANEL - Abnormal; Notable for the following components:      Result Value   Potassium 3.0 (*)    Glucose, Bld 151 (*)    All other components within normal limits  CBC WITH DIFFERENTIAL/PLATELET  LIPASE, BLOOD  HCG, QUANTITATIVE, PREGNANCY  MAGNESIUM    EKG None  Radiology CT Abdomen Pelvis W Contrast  Result Date: 05/12/2022 CLINICAL DATA:  RIGHT lower quadrant abdominal pain for months worse in past week EXAM: CT ABDOMEN AND  PELVIS WITH CONTRAST TECHNIQUE: Multidetector CT imaging of the abdomen and pelvis was performed using the standard protocol following bolus administration of intravenous contrast. RADIATION DOSE REDUCTION: This exam was performed according to the departmental dose-optimization program which includes automated exposure control, adjustment of the mA and/or kV according to patient size and/or use of iterative reconstruction technique. CONTRAST:  74mL OMNIPAQUE IOHEXOL 350 MG/ML SOLN IV. No oral contrast. COMPARISON:  11/25/2008 FINDINGS: Lower chest: Lung bases clear Hepatobiliary: Gallbladder and liver normal appearance Pancreas: Normal appearance Spleen: Normal appearance Adrenals/Urinary Tract: Tiny BILATERAL renal cysts; no follow-up imaging recommended. Adrenal glands, kidneys, ureters, and bladder otherwise normal appearance. Stomach/Bowel: Base of appendix normal appearance, remainder obscured. Minimal distal colonic diverticulosis. Stomach and bowel loops otherwise normal appearance Vascular/Lymphatic: Vascular structures patent. Aorta normal caliber with atherosclerotic calcifications. No adenopathy. Reproductive: Enlarged uterus containing multiple nodules up to 5.1 cm consistent with leiomyomata. 3.6 cm diameter simple appearing LEFT ovarian cyst; no follow-up imaging recommended. Other: No free air or free fluid. No hernia or inflammatory process. Musculoskeletal: Unremarkable IMPRESSION: Enlarged uterus containing multiple nodules up to 5.1 cm consistent with leiomyomata. Minimal distal colonic diverticulosis without evidence of diverticulitis. No acute intra-abdominal or intrapelvic abnormalities. Aortic Atherosclerosis (ICD10-I70.0). Electronically Signed   By: Lavonia Dana M.D.   On: 05/12/2022 10:12    Procedures Procedures    Medications Ordered in ED Medications  dicyclomine (BENTYL) capsule 10 mg (10 mg Oral Given 05/12/22 0817)  iohexol (OMNIPAQUE) 350 MG/ML injection 100 mL (80 mLs  Intravenous Contrast Given 05/12/22 0951)  potassium chloride SA (KLOR-CON M) CR tablet 40 mEq (40 mEq Oral Given 05/12/22 1011)    ED Course/ Medical Decision Making/ A&P Clinical Course as of 05/12/22 1044  Tue May 12, 2022  [id number] Potassium(!): 3.0 [JL]    Clinical Course User Index [JL] Regan Lemming, MD                             Medical Decision Making Amount and/or Complexity of Data Reviewed Labs: ordered. Decision-making details documented in ED Course. Radiology: ordered.  Risk Prescription drug management.      52 year old female with medical history significant for hypertension who presents to the emergency department with a chief complaint of right lower quadrant abdominal pain.  The patient states that it began a couple of months ago and has been progressively getting worse.  It initially was intermittent and now is persistent all the time in the right lower quadrant.  She denies any weight loss.  She denies any fevers or chills.  She denies any nausea, vomiting, diarrhea, dysuria.  She does endorse intermittent constipation.  Her last bowel movement was yesterday and she  passed some pellets.  She states that she has tried medications for constipation previously but cannot remember the name of them.  Not yet followed up with her PCP regarding her discomfort. .  She denies any vaginal bleeding or vaginal discharge.  She states that she still gets menstrual periods but they are more irregular now occurring every 3 months.  On arrival, the patient was afebrile, not tachycardic or tachypneic, BP 152/82, saturating 98% on room air.  Physical exam significant for light right lower quadrant tenderness to palpation, no rebound or guarding.  Differential diagnosis for the patient's chronic abdominal pain includes most likely constipation, also considered intra-abdominal mass/malignancy, no hernia palpated on exam but given the patient's obesity omental hernia is a possibility.  Lower  suspicion for acute process such as appendicitis, cholecystitis, pancreatitis, diverticulitis.  Discussed with the patient plan for laboratory workup and imaging.  The patient was administered Bentyl.  Laboratory evaluation significant for CBC without a leukocytosis or anemia, CMP with mild hypokalemia 3.0, otherwise unremarkable, low potassium replenished orally, lipase normal, magnesium normal, hCG negative.  CT abdomen pelvis revealed the following: IMPRESSION:  Enlarged uterus containing multiple nodules up to 5.1 cm consistent  with leiomyomata.    Minimal distal colonic diverticulosis without evidence of  diverticulitis.    No acute intra-abdominal or intrapelvic abnormalities.    Aortic Atherosclerosis (ICD10-I70.0).   Patient symptoms are likely due to chronic abdominal cramping and pain associated with her uterine fibroids.  Recommended that she follow-up outpatient with a gynecologist for further recommendations and management.  The patient was found to have colonic diverticulosis without evidence of diverticulitis.  Return precautions provided in the event of signs and symptoms of diverticulitis.  Recommended Tylenol and Motrin for pain control, stable for outpatient follow-up.  The patient has been appropriately medically screened and/or stabilized in the ED. I have low suspicion for any other emergent medical condition which would require further screening, evaluation or treatment in the ED or require inpatient management.    Final Clinical Impression(s) / ED Diagnoses Final diagnoses:  Chronic abdominal pain  Uterine leiomyoma, unspecified location  Hypokalemia  Diverticulosis    Rx / DC Orders ED Discharge Orders     None         Regan Lemming, MD 05/12/22 1046

## 2023-01-25 ENCOUNTER — Encounter: Payer: Self-pay | Admitting: Gastroenterology

## 2023-03-08 ENCOUNTER — Ambulatory Visit (AMBULATORY_SURGERY_CENTER): Payer: Managed Care, Other (non HMO)

## 2023-03-08 VITALS — Ht 60.0 in | Wt 198.0 lb

## 2023-03-08 DIAGNOSIS — Z1211 Encounter for screening for malignant neoplasm of colon: Secondary | ICD-10-CM

## 2023-03-08 MED ORDER — SUFLAVE 178.7 G PO SOLR: 1.0000 | Freq: Once | ORAL | 0 refills | Status: AC

## 2023-03-08 NOTE — Progress Notes (Signed)
Pre visit completed via phone call; Patient verified name, DOB, and address; No egg or soy allergy known to patient; No issues known to pt with past sedation with any surgeries or procedures; Patient denies ever being told they had issues or difficulty with intubation;  No FH of Malignant Hyperthermia; Pt is not on diet pills; Pt is not on home 02;  Pt is not on blood thinners;  Pt denies issues with constipation-patient   No A fib or A flutter; Have any cardiac testing pending--NO Insurance verified during PV appt--- [employer] Pt can ambulate without assistance;  Pt denies use of chewing tobacco; Discussed diabetic/weight loss medication holds; Discussed NSAID holds; Checked BMI to be less than 50; Pt instructed to use [employer] or GoodRx for a price reduction on prep;  Patient's chart reviewed by Cathlyn Parsons CNRA prior to previsit and patient appropriate for the [facility];  Pre visit completed and red dot placed by patient's name on their procedure day (on provider's schedule);    Patient advised to call back to office/send message thru MyChart to have her instructions sent to MyChart for her to obtain;  Instructions also printed and placed on the 2nd floor for her to pick up at her convenience; [facility] offered- patient adamantly declined; 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

## 2023-03-12 ENCOUNTER — Encounter: Payer: Self-pay | Admitting: Gastroenterology

## 2023-03-23 ENCOUNTER — Ambulatory Visit: Payer: Managed Care, Other (non HMO) | Admitting: Gastroenterology

## 2023-03-23 ENCOUNTER — Encounter: Payer: Self-pay | Admitting: Gastroenterology

## 2023-03-23 VITALS — BP 130/70 | HR 78 | Temp 97.3°F | Resp 18 | Ht 60.0 in | Wt 198.0 lb

## 2023-03-23 DIAGNOSIS — K635 Polyp of colon: Secondary | ICD-10-CM | POA: Diagnosis not present

## 2023-03-23 DIAGNOSIS — D125 Benign neoplasm of sigmoid colon: Secondary | ICD-10-CM | POA: Diagnosis not present

## 2023-03-23 DIAGNOSIS — D124 Benign neoplasm of descending colon: Secondary | ICD-10-CM

## 2023-03-23 DIAGNOSIS — D123 Benign neoplasm of transverse colon: Secondary | ICD-10-CM

## 2023-03-23 DIAGNOSIS — Z1211 Encounter for screening for malignant neoplasm of colon: Secondary | ICD-10-CM

## 2023-03-23 DIAGNOSIS — D128 Benign neoplasm of rectum: Secondary | ICD-10-CM

## 2023-03-23 DIAGNOSIS — R195 Other fecal abnormalities: Secondary | ICD-10-CM | POA: Diagnosis not present

## 2023-03-23 MED ORDER — SODIUM CHLORIDE 0.9 % IV SOLN: 500.0000 mL | INTRAVENOUS | Status: DC

## 2023-03-23 NOTE — Op Note (Signed)
[facility] Patient Name: Anna White Procedure Date: 03/23/2023 10:14 AM MRN: [id number] Endoscopist: Lorin Picket E. Tomasa Rand , MD, [id number] Age: 53 Referring MD:  Date of Birth: Sep 16, 1970 Gender: Female Account #: [id number] Procedure:                Colonoscopy Indications:              Positive Cologuard test Medicines:                Monitored Anesthesia Care Procedure:                Pre-Anesthesia Assessment:                           - Prior to the procedure, a History and Physical                            was performed, and patient medications and                            allergies were reviewed. The patient's tolerance of                            previous anesthesia was also reviewed. The risks                            and benefits of the procedure and the sedation                            options and risks were discussed with the patient.                            All questions were answered, and informed consent                            was obtained. Prior Anticoagulants: The patient has                            taken no anticoagulant or antiplatelet agents. ASA                            Grade Assessment: II - A patient with mild systemic                            disease. After reviewing the risks and benefits,                            the patient was deemed in satisfactory condition to                            undergo the procedure.                           After obtaining informed consent, the colonoscope  was passed under direct vision. Throughout the                            procedure, the patient's blood pressure, pulse, and                            oxygen saturations were monitored continuously. The                            Olympus CF-HQ190L ([id number]) Colonoscope was                            introduced through the anus and advanced to the the                            terminal ileum, with  identification of the                            appendiceal orifice and IC valve. The colonoscopy                            was performed without difficulty. The patient                            tolerated the procedure well. The quality of the                            bowel preparation was adequate. The terminal ileum,                            ileocecal valve, appendiceal orifice, and rectum                            were photographed. The bowel preparation used was                            Suflave via split dose instruction. Scope In: 10:33:21 AM Scope Out: 10:53:37 AM Scope Withdrawal Time: 0 hours 15 minutes 43 seconds  Total Procedure Duration: 0 hours 20 minutes 16 seconds  Findings:                 The perianal and digital rectal examinations were                            normal. Pertinent negatives include normal                            sphincter tone and no palpable rectal lesions.                           A 6 mm polyp was found in the transverse colon. The                            polyp was sessile. The polyp was removed  with a                            cold snare. Resection and retrieval were complete.                            Estimated blood loss was minimal.                           Many sessile polyps were found in the rectum,                            sigmoid colon and descending colon. The polyps were                            small in size. Two of these polyps were removed                            with a cold snare. Resection and retrieval were                            complete. Estimated blood loss was minimal.                           A few small-mouthed diverticula were found in the                            sigmoid colon and ascending colon.                           The exam was otherwise normal throughout the                            examined colon.                           The retroflexed view of the distal rectum and anal                             verge was normal and showed no anal or rectal                            abnormalities. Complications:            No immediate complications. Estimated Blood Loss:     Estimated blood loss was minimal. Impression:               - One 6 mm polyp in the transverse colon, removed                            with a cold snare. Resected and retrieved.                           - Many small polyps in the rectum, in the sigmoid  colon and in the descending colon, removed with a                            cold snare. Two were resected and retrieved. These                            were consistent with hyperplastic polyps.                           - Mild diverticulosis in the sigmoid and ascending                            colon.                           - The distal rectum and anal verge are normal on                            retroflexion view. Recommendation:           - Patient has a contact number available for                            emergencies. The signs and symptoms of potential                            delayed complications were discussed with the                            patient. Return to normal activities tomorrow.                            Written discharge instructions were provided to the                            patient.                           - Resume previous diet.                           - Continue present medications.                           - Await pathology results.                           - Repeat colonoscopy (date not yet determined) for                            surveillance based on pathology results. Bushra Denman E. Tomasa Rand, MD 03/23/2023 11:02:45 AM This report has been signed electronically.

## 2023-03-23 NOTE — Progress Notes (Signed)
Called to room to assist during endoscopic procedure.  Patient ID and intended procedure confirmed with present staff. Received instructions for my participation in the procedure from the performing physician.

## 2023-03-23 NOTE — Progress Notes (Signed)
[facility] History and Physical   Primary Care Physician:  Fleet Contras, MD   Reason for Procedure:  Positive Cologuard  Plan:    Colonoscopy     HPI: Anna White is a 53 y.o. female undergoing colonoscopy following a positive Cologuard in November 2024.  She has no family history of colon cancer and no chronic GI symptoms.    Past Medical History:  Diagnosis Date   Hypertension     Past Surgical History:  Procedure Laterality Date   DENTAL SURGERY     IR RADIOLOGIST EVAL & MGMT  03/21/2021    Prior to Admission medications   Medication Sig Start Date End Date Taking? Authorizing Provider  amLODipine (NORVASC) 10 MG tablet Take 10 mg by mouth daily.   Yes [provider]  hydrochlorothiazide (MICROZIDE) 12.5 MG capsule Take 12.5 mg by mouth daily.   Yes [provider]  aspirin EC (BAYER ASPIRIN EC LOW DOSE) 81 MG tablet Take 81 mg by mouth daily.    [provider]  cyclobenzaprine (FLEXERIL) 10 MG tablet Take 10 mg by mouth 2 (two) times daily as needed.    [provider]  diclofenac (VOLTAREN) 75 MG EC tablet Take 75 mg by mouth 2 (two) times daily as needed. 02/27/23   [provider]  ELDERBERRY PO Take 1 tablet by mouth daily at 6 (six) AM.    [provider]  gabapentin (NEURONTIN) 100 MG capsule Take 100 mg by mouth daily. 03/05/23   [provider]  Multiple Vitamins-Minerals (MULTIPLE VITAMINS/WOMENS PO) Take 1 tablet by mouth daily at 6 (six) AM.    [provider]  Vitamin D, Ergocalciferol, (DRISDOL) 1.25 MG (50000 UNIT) CAPS capsule Take 50,000 Units by mouth once a week.    [provider]    Current Outpatient Medications  Medication Sig Dispense Refill   amLODipine (NORVASC) 10 MG tablet Take 10 mg by mouth daily.     hydrochlorothiazide (MICROZIDE) 12.5 MG capsule Take 12.5 mg by mouth daily.     aspirin EC (BAYER ASPIRIN EC LOW DOSE) 81 MG tablet Take 81  mg by mouth daily.     cyclobenzaprine (FLEXERIL) 10 MG tablet Take 10 mg by mouth 2 (two) times daily as needed.     diclofenac (VOLTAREN) 75 MG EC tablet Take 75 mg by mouth 2 (two) times daily as needed.     ELDERBERRY PO Take 1 tablet by mouth daily at 6 (six) AM.     gabapentin (NEURONTIN) 100 MG capsule Take 100 mg by mouth daily.     Multiple Vitamins-Minerals (MULTIPLE VITAMINS/WOMENS PO) Take 1 tablet by mouth daily at 6 (six) AM.     Vitamin D, Ergocalciferol, (DRISDOL) 1.25 MG (50000 UNIT) CAPS capsule Take 50,000 Units by mouth once a week.     Current Facility-Administered Medications  Medication Dose Route Frequency Provider Last Rate Last Admin   0.9 %  sodium chloride infusion  500 mL Intravenous Continuous Jenel Lucks, MD        Allergies as of 03/23/2023   (No Known Allergies)    Family History  Problem Relation Age of Onset   Colon cancer Neg Hx    Colon polyps Neg Hx    Esophageal cancer Neg Hx    Rectal cancer Neg Hx    Stomach cancer Neg Hx     Social History   Socioeconomic History   Marital status: Single    Spouse name: Not on  file   Number of children: Not on file   Years of education: Not on file   Highest education level: Not on file  Occupational History   Not on file  Tobacco Use   Smoking status: Former    Types: Cigarettes   Smokeless tobacco: Not on file  Vaping Use   Vaping status: Never Used  Substance and Sexual Activity   Alcohol use: Yes    Comment: occasional   Drug use: Not Currently   Sexual activity: Not on file  Other Topics Concern   Not on file  Social History Narrative   Not on file   Social Drivers of Health   Financial Resource Strain: Not on file  Food Insecurity: Not on file  Transportation Needs: Not on file  Physical Activity: Not on file  Stress: Not on file  Social Connections: Not on file  Intimate Partner Violence: Not on file    Review of Systems:  All other review of systems negative  except as mentioned in the HPI.  Physical Exam: Vital signs BP (!) 142/85   Pulse 81   Temp (!) 97.3 F (36.3 C) (Skin)   Resp 16   Ht 5' (1.524 m)   Wt 198 lb (89.8 kg)   SpO2 99%   BMI 38.67 kg/m   General:   Alert,  Well-developed, well-nourished, pleasant and cooperative in NAD Airway:  Mallampati 2 Lungs:  Clear throughout to auscultation.   Heart:  Regular rate and rhythm; no murmurs, clicks, rubs,  or gallops. Abdomen:  Soft, nontender and nondistended. Normal bowel sounds.   Neuro/Psych:  Normal mood and affect. A and O x 3   Oseph Imburgia E. Tomasa Rand, MD [facility]

## 2023-03-23 NOTE — Patient Instructions (Addendum)
Handouts on polyps & diverticulosis given to you today.   Await pathology results on polyps removed   Continue previous diet & medications    YOU HAD AN ENDOSCOPIC PROCEDURE TODAY AT THE [facility]:   Refer to the procedure report that was given to you for any specific questions about what was found during the examination.  If the procedure report does not answer your questions, please call your gastroenterologist to clarify.  If you requested that your care partner not be given the details of your procedure findings, then the procedure report has been included in a sealed envelope for you to review at your convenience later.  YOU SHOULD EXPECT: Some feelings of bloating in the abdomen. Passage of more gas than usual.  Walking can help get rid of the air that was put into your GI tract during the procedure and reduce the bloating. If you had a lower endoscopy (such as a colonoscopy or flexible sigmoidoscopy) you may notice spotting of blood in your stool or on the toilet paper. If you underwent a bowel prep for your procedure, you may not have a normal bowel movement for a few days.  Please Note:  You might notice some irritation and congestion in your nose or some drainage.  This is from the oxygen used during your procedure.  There is no need for concern and it should clear up in a day or so.  SYMPTOMS TO REPORT IMMEDIATELY:  Following lower endoscopy (colonoscopy or flexible sigmoidoscopy):  Excessive amounts of blood in the stool  Significant tenderness or worsening of abdominal pains  Swelling of the abdomen that is new, acute  Fever of 100F or higher   For urgent or emergent issues, a gastroenterologist can be reached at any hour by calling [phone]. Do not use MyChart messaging for urgent concerns.    DIET:  We do recommend a small meal at first, but then you may proceed to your regular diet.  Drink plenty of fluids but you should avoid alcoholic  beverages for 24 hours.  ACTIVITY:  You should plan to take it easy for the rest of today and you should NOT DRIVE or use heavy machinery until tomorrow (because of the sedation medicines used during the test).    FOLLOW UP: Our staff will call the number listed on your records the next business day following your procedure.  We will call around 7:15- 8:00 am to check on you and address any questions or concerns that you may have regarding the information given to you following your procedure. If we do not reach you, we will leave a message.     If any biopsies were taken you will be contacted by phone or by letter within the next 1-3 weeks.  Please call us at [phone] if you have not heard about the biopsies in 3 weeks.    SIGNATURES/CONFIDENTIALITY: You and/or your care partner have signed paperwork which will be entered into your electronic medical record.  These signatures attest to the fact that that the information above on your After Visit Summary has been reviewed and is understood.  Full responsibility of the confidentiality of this discharge information lies with you and/or your care-partner.

## 2023-03-23 NOTE — Progress Notes (Signed)
Patient states there have been no changes to medical or surgical history since time of pre-visit.

## 2023-03-23 NOTE — Progress Notes (Signed)
Report to PACU, RN, vss, BBS= Clear.

## 2023-03-24 ENCOUNTER — Telehealth: Payer: Self-pay

## 2023-03-24 NOTE — Telephone Encounter (Signed)
  Follow up Call-     03/23/2023    9:47 AM  Call back number  Post procedure Call Back phone  # [phone]  Permission to leave phone message Yes    Post op call attempted, no answer, left VM.

## 2023-03-25 LAB — SURGICAL PATHOLOGY

## 2023-03-30 ENCOUNTER — Encounter: Payer: Self-pay | Admitting: Gastroenterology

## 2023-03-30 NOTE — Progress Notes (Signed)
 Anna White,  Good news: the polyps that I removed during your recent examination were NOT precancerous.  You should continue to follow current colorectal cancer screening guidelines with a repeat colonoscopy in 10 years.    If you develop any new rectal bleeding, abdominal pain or significant bowel habit changes, please contact me before then.

## 2023-06-14 ENCOUNTER — Ambulatory Visit (INDEPENDENT_AMBULATORY_CARE_PROVIDER_SITE_OTHER)

## 2023-06-14 ENCOUNTER — Ambulatory Visit: Admitting: Podiatry

## 2023-06-14 DIAGNOSIS — M7989 Other specified soft tissue disorders: Secondary | ICD-10-CM

## 2023-06-14 DIAGNOSIS — M79675 Pain in left toe(s): Secondary | ICD-10-CM

## 2023-06-14 DIAGNOSIS — B351 Tinea unguium: Secondary | ICD-10-CM

## 2023-06-14 DIAGNOSIS — M79674 Pain in right toe(s): Secondary | ICD-10-CM | POA: Diagnosis not present

## 2023-06-14 NOTE — Progress Notes (Signed)
  Subjective:  Patient ID: Anna White, female    DOB: 09-25-1970,  MRN: [id number]  No chief complaint on file.   Discussed the use of AI scribe software for clinical note transcription with the patient, who gave verbal consent to proceed.  History of Present Illness Anna White is a 53 year old female who presents with a painful lesion on the bottom of her foot.  The lesion is located on the plantar aspect of her foot and is described as a 'little circle.' It first appeared in February and has not changed in size. She experiences significant soreness when walking. There is no history of injury or trauma to the area, and she rarely walks barefoot. She has not sought prior treatment and has only tried soaking her feet for relief. She is not diabetic. No other associated symptoms are present.  Also asking for nails be trimmed as they are thick and she has difficulty do them herself.  No swelling or redness or any drainage.  No open lesions.      Objective:    Physical Exam General: AAO x3, NAD  Dermatological: Nails are hypertrophic, dystrophic, brittle, discolored, elongated 10. No surrounding redness or drainage. Tenderness nails 1-5 bilaterally. No open lesions or pre-ulcerative lesions are identified today.  Vascular: Dorsalis Pedis artery and Posterior Tibial artery pedal pulses are 2/4 bilateral with immedate capillary fill time. There is no pain with calf compression, swelling, warmth, erythema.   Neruologic: Grossly intact via light touch bilateral.   Musculoskeletal: Firm soft tissue mass noted along the arch of the right foot.  She gets some tenderness in this area.  There is no erythema or warmth.  There are no open lesions.  There is no fluctuation or crepitation or no drainable collection identified at this time.  Gait: Unassisted, Nonantalgic.     No images are attached to the encounter.    Results  RADIOLOGY Right foot X-ray: No abnormalities  detected; no calcifications noted or foreign body.  (06/14/2023)   Assessment:   1. Soft tissue mass   2. Dermatophytosis of nail      Plan:  Patient was evaluated and treated and all questions answered.  Assessment and Plan Assessment & Plan Plantar fibroma -X-rays negative. - Order foot ultrasound. - Provide donut pads to offload pressure. - Recommend over-the-counter Voltaren gel for now  Symptomatic onychomycosis - Sharply debrided nails x 10 without any complications or bleeding. - Culture nail samples.  Sent to [facility]   No follow-ups on file.   Anna White DPM

## 2023-06-21 ENCOUNTER — Encounter: Payer: Self-pay | Admitting: Podiatry

## 2023-07-16 ENCOUNTER — Encounter: Payer: Self-pay | Admitting: Podiatry

## 2023-07-19 ENCOUNTER — Ambulatory Visit: Payer: Self-pay | Admitting: Podiatry

## 2023-09-16 ENCOUNTER — Ambulatory Visit: Admitting: Podiatry

## 2023-09-20 ENCOUNTER — Ambulatory Visit: Admitting: Podiatry

## 2023-09-20 DIAGNOSIS — B351 Tinea unguium: Secondary | ICD-10-CM | POA: Diagnosis not present

## 2023-09-20 DIAGNOSIS — M7751 Other enthesopathy of right foot: Secondary | ICD-10-CM | POA: Diagnosis not present

## 2023-09-20 MED ORDER — TRIAMCINOLONE ACETONIDE 10 MG/ML IJ SUSP
5.0000 mg | Freq: Once | INTRAMUSCULAR | Status: AC
  Administered 2023-09-20 (×2): 5 mg via INTRAMUSCULAR

## 2023-09-20 MED ORDER — EFINACONAZOLE 10 % EX SOLN: 1.0000 [drp] | Freq: Every day | CUTANEOUS | 11 refills | Status: AC

## 2023-09-20 NOTE — Progress Notes (Signed)
 Subjective:  Patient ID: Anna White, female    DOB: 1971/01/12,  MRN: [id number]  Chief Complaint  Patient presents with   Foot Pain    Pt stated that she is here to go over test results     Discussed the use of AI scribe software for clinical note transcription with the patient, who gave verbal consent to proceed.  History of Present Illness Anna White is a 53 year old female who presents with foot pain.  She experiences foot pain that began at the bottom of her foot and has now spread to the entire foot. 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 Voltaren cream has been used as an anti-inflammatory treatment without significant relief. She has not sustained any recent foot injuries.  Her ankles swell, and she takes hydrochlorothiazide and wears compression socks while working. She recently switched from [employer] to [employer], which she finds more comfortable.  She also previously had a nail culture performed,.      Objective:    Physical Exam General: AAO x3, NAD  Dermatological: Nails remain hypertrophic, dystrophic.  They are uniform in color to the all the nails mostly, but difficult to fully evaluate given the nail polish today.  No open lesions.  No edema, erythema.  Vascular: Dorsalis Pedis artery and Posterior Tibial artery pedal pulses are 2/4 bilateral with immedate capillary fill time.  There is no pain with calf compression, swelling, warmth, erythema.   Neruologic: Grossly intact via light touch bilateral.   Musculoskeletal: Tenderness palpation submetatarsal area mostly on the second, third interspace.  There is no area pinpoint tenderness.  Unable to appreciate any area of fluid collection or drainable abscess or collection today.  There is no erythema or warmth.  Gait: Unassisted, Nonantalgic.     No images are attached to the encounter.    Results    Assessment:    1. Bursitis of intermetatarsal bursa of right foot   2. Dermatophytosis of nail      Plan:  Patient was evaluated and treated and all questions answered.  Assessment and Plan Assessment & Plan Chronic right foot pain with inflammation Chronic pain in the ball of the right foot likely due to occupational strain. Ultrasound showed possible seroma or hematoma, although clinically not able to appreciate this. Voltaren cream was ineffective. - Administer steroid injection in the third interspace of the right foot at this is with major tenderness of the legs.  Appropriate consent obtained and she wants to proceed with this.  I cleaned the skin with alcohol.  Mixture of 1 cc Kenalog  10, 0.5 cc of Marcaine plain, 0.5 cc lidocaine plain was infiltrated into the third interspace without complications.  Postinjection care discussed.  Tolerated well. - Apply metatarsal pad. - Advise acetaminophen or ibuprofen post-injection. - Recommend compression socks and supportive shoes. - Re-evaluate in two months.  Onychomycosis of toenails Onychomycosis confirmed by nail culture. Oral antifungal has higher success but risk of hepatotoxicity. Topical treatment chosen due to current medication regimen and preference. - Prescribe Jublia  topical antifungal from [facility]. - Instruct to contact provider if pharmacy does not reach out or if medication is not covered or too expensive.  Chronic lower extremity edema Chronic edema managed with hydrochlorothiazide and compression socks. - Continue wearing compression socks. - Continue hydrochlorothiazide.  Return in about 2 months (around 11/20/2023) for right foot pain, nail fungus .   Donnice  JONELLE Fees DPM

## 2023-09-20 NOTE — Patient Instructions (Signed)

## 2023-10-12 ENCOUNTER — Other Ambulatory Visit: Payer: Self-pay | Admitting: Podiatry

## 2023-10-12 MED ORDER — CICLOPIROX 8 % EX SOLN: Freq: Every day | CUTANEOUS | 2 refills | Status: AC

## 2023-10-12 NOTE — Progress Notes (Signed)
 Received notice Jublia  not covered and needed a step-one medication. Sent ciclopirox .

## 2023-11-22 ENCOUNTER — Ambulatory Visit: Admitting: Podiatry

## 2024-03-06 ENCOUNTER — Ambulatory Visit: Admitting: Podiatry
